# Patient Record
Sex: Female | Born: 1970 | Race: White | Hispanic: Yes | Marital: Married | Smoking: Never smoker
Health system: Southern US, Community
[De-identification: ages and names within clinical notes are randomized; demographics above are authoritative.]

---

## 2016-04-21 ENCOUNTER — Emergency Department (HOSPITAL_COMMUNITY): Payer: Worker's Compensation

## 2016-04-21 ENCOUNTER — Emergency Department (HOSPITAL_COMMUNITY)
Admission: EM | Admit: 2016-04-21 | Discharge: 2016-04-21 | Disposition: A | Discharge status: Home / Self Care | Payer: Worker's Compensation | Attending: Emergency Medicine | Admitting: Emergency Medicine

## 2016-04-21 ENCOUNTER — Encounter (HOSPITAL_COMMUNITY): Payer: Self-pay | Admitting: Emergency Medicine

## 2016-04-21 DIAGNOSIS — N390 Urinary tract infection, site not specified: Secondary | ICD-10-CM | POA: Diagnosis not present

## 2016-04-21 DIAGNOSIS — M5416 Radiculopathy, lumbar region: Secondary | ICD-10-CM | POA: Diagnosis not present

## 2016-04-21 DIAGNOSIS — M545 Low back pain, unspecified: Secondary | ICD-10-CM

## 2016-04-21 LAB — CBC WITH DIFFERENTIAL/PLATELET
BASOS ABS: 0 10*3/uL (ref 0.0–0.1)
Basophils Relative: 0 %
EOS PCT: 1 %
Eosinophils Absolute: 0.1 10*3/uL (ref 0.0–0.7)
HEMATOCRIT: 38.8 % (ref 36.0–46.0)
HEMOGLOBIN: 13.5 g/dL (ref 12.0–15.0)
LYMPHS ABS: 1.8 10*3/uL (ref 0.7–4.0)
LYMPHS PCT: 21 %
MCH: 31.1 pg (ref 26.0–34.0)
MCHC: 34.8 g/dL (ref 30.0–36.0)
MCV: 89.4 fL (ref 78.0–100.0)
Monocytes Absolute: 0.4 10*3/uL (ref 0.1–1.0)
Monocytes Relative: 5 %
NEUTROS ABS: 6.5 10*3/uL (ref 1.7–7.7)
Neutrophils Relative %: 73 %
Platelets: 250 10*3/uL (ref 150–400)
RBC: 4.34 MIL/uL (ref 3.87–5.11)
RDW: 11.7 % (ref 11.5–15.5)
WBC: 8.8 10*3/uL (ref 4.0–10.5)

## 2016-04-21 LAB — URINALYSIS, ROUTINE W REFLEX MICROSCOPIC
Bilirubin Urine: NEGATIVE
GLUCOSE, UA: NEGATIVE mg/dL
HGB URINE DIPSTICK: NEGATIVE
Ketones, ur: NEGATIVE mg/dL
LEUKOCYTES UA: NEGATIVE
Nitrite: POSITIVE — AB
PROTEIN: NEGATIVE mg/dL
SPECIFIC GRAVITY, URINE: 1.043 — AB (ref 1.005–1.030)
pH: 6.5 (ref 5.0–8.0)

## 2016-04-21 LAB — BASIC METABOLIC PANEL
ANION GAP: 8 (ref 5–15)
BUN: 10 mg/dL (ref 6–20)
CHLORIDE: 106 mmol/L (ref 101–111)
CO2: 23 mmol/L (ref 22–32)
Calcium: 9.2 mg/dL (ref 8.9–10.3)
Creatinine, Ser: 0.72 mg/dL (ref 0.44–1.00)
GFR calc Af Amer: >60 mL/min (ref >60)
GFR calc non Af Amer: >60 mL/min (ref >60)
GLUCOSE: 97 mg/dL (ref 65–99)
POTASSIUM: 4.2 mmol/L (ref 3.5–5.1)
Sodium: 137 mmol/L (ref 135–145)

## 2016-04-21 LAB — URINE MICROSCOPIC-ADD ON

## 2016-04-21 MED ORDER — CEPHALEXIN 500 MG PO CAPS: 500.0000 mg | ORAL_CAPSULE | Freq: Four times a day (QID) | ORAL | Status: AC

## 2016-04-21 MED ORDER — CEFTRIAXONE SODIUM 1 G IJ SOLR
1.0000 g | Freq: Once | INTRAMUSCULAR | Status: AC
  Administered 2016-04-21: 1 g via INTRAMUSCULAR
  Filled 2016-04-21: qty 10

## 2016-04-21 MED ORDER — DEXAMETHASONE 2 MG PO TABS: ORAL_TABLET | ORAL | Status: AC

## 2016-04-21 MED ORDER — GADOBENATE DIMEGLUMINE 529 MG/ML IV SOLN
15.0000 mL | Freq: Once | INTRAVENOUS | Status: AC | PRN
  Administered 2016-04-21: 15 mL via INTRAVENOUS

## 2016-04-21 MED ORDER — HYDROMORPHONE HCL 1 MG/ML IJ SOLN
0.5000 mg | Freq: Once | INTRAMUSCULAR | Status: DC
  Filled 2016-04-21: qty 1

## 2016-04-21 MED ORDER — LIDOCAINE HCL (PF) 1 % IJ SOLN
5.0000 mL | Freq: Once | INTRAMUSCULAR | Status: AC
  Administered 2016-04-21: 5 mL
  Filled 2016-04-21: qty 5

## 2016-04-21 MED ORDER — OXYCODONE-ACETAMINOPHEN 5-325 MG PO TABS: 1.0000 | ORAL_TABLET | Freq: Four times a day (QID) | ORAL | Status: AC | PRN

## 2016-04-21 MED ORDER — OXYCODONE-ACETAMINOPHEN 5-325 MG PO TABS
1.0000 | ORAL_TABLET | Freq: Once | ORAL | Status: AC
  Administered 2016-04-21: 1 via ORAL
  Filled 2016-04-21: qty 1

## 2016-04-21 NOTE — ED Notes (Signed)
Pt here s/p back injury at work while lifting something heavy on 5/5. Pt sts pain radiates to left side. Pt also reports loss of control of bowels and bladder x 1 week. Pt ambulatory. PCP sent pt here for eval of cauda equina syndrome.

## 2016-04-21 NOTE — Discharge Instructions (Signed)
Your MRI Results: 1. 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 2. De lo contrario la resonancia magntica negativa de la columna lumbar. 3. Ninguna compresin focal de la cauda equina de otra manera.   Citica  (Sciatica)  La citica es Chief Technology Officerel dolor, debilidad, entumecimiento u hormigueo a lo largo del nervio citico. El nervio comienza en la zona inferior de la espalda y desciende por la parte posterior de cada pierna. El nervio controla los msculos de la parte inferior de la pierna y de la zona posterior de la rodilla, y transmite la sensibilidad a la parte posterior del muslo, la pierna y la planta del pie. La citica es un sntoma de otras afecciones mdicas. Por ejemplo, un dao a los nervios o algunas enfermedades como un disco herniado o un espoln seo en la columna vertebral, podran daarle o presionar en el nervio citico. Esto causa dolor, debilidad y otras sensaciones normalmente asociadas con la citica. Generalmente la citica afecta slo un lado del cuerpo. CAUSAS   Disco herniado o desplazado.  Enfermedad degenerativa del disco.  Un sndrome doloroso que compromete un msculo angosto de los glteos (sndrome piriforme).  Lesin o fractura plvica.  Embarazo.  Tumor (casos raros). SNTOMAS  Los sntomas pueden variar de leves a muy graves. Por lo general, los sntomas descienden desde la zona lumbar a las nalgas y la parte posterior de la pierna. Ellos son:   Hormigueo leve o dolor sordo en la parte inferior de la espalda, la pierna o la cadera.  Adormecimiento en la parte posterior de la pantorrilla o la planta del pie.  Sensacin de KeySpanquemazn en la zona lumbar, la pierna o la cadera.  Dolor agudo en la zona inferior de la espalda, la pierna o la cadera.  Debilidad en las piernas.  Dolor de espalda intenso que Raytheoninhibe los movimientos. Los sntomas pueden empeorar al toser, Engineering geologistestornudar, rer o estar sentado o parado  durante Con-waymucho tiempo. Adems, el sobrepeso puede empeorar los sntomas.  DIAGNSTICO  Su mdico le har un examen fsico para buscar los sntomas comunes de la citica. Le pedir que haga algunos movimientos o actividades que activaran el dolor del nervio citico. Para encontrar las causas de la citica podr indicarle otros estudios. Estos pueden ser:   Anlisis de [place]re.  Radiografas.  Pruebas de diagnstico por imgenes, como resonancia magntica o tomografa computada. TRATAMIENTO  El tratamiento se dirige a las causas de la citica. A veces, el tratamiento no es necesario, y Chief Technology Officerel dolor y Environmental health practitionerel malestar desaparecen por s mismos. Si necesita tratamiento, su mdico puede sugerir:   Medicamentos de venta libre para Engineer, materialsaliviar el dolor.  Medicamentos recetados, como antiinflamatorios, relajantes musculares o narcticos.  Aplicacin de calor o hielo en la zona del dolor.  Inyecciones de corticoides para disminuir el dolor, la irritacin y la inflamacin alrededor del nervio.  Reduccin de la Marriottactividad en los perodos de [place] Viewdolor.  Ejercicios y estiramiento del abdomen para fortalecer y Scientist, clinical (histocompatibility and immunogenetics)mejorar la flexibilidad de la columna vertebral. Su mdico puede sugerirle perder peso si el peso extra empeora el dolor de espalda.  Fisioterapia.  La ciruga para eliminar lo que presiona o pincha el nervio, como un espoln seo o parte de una hernia de disco. INSTRUCCIONES PARA EL CUIDADO EN EL HOGAR   Slo tome medicamentos de venta libre o recetados para Primary school teachercalmar el dolor o Environmental health practitionerel malestar, segn las indicaciones de su mdico.  Aplique hielo sobre el rea dolorida durante 20  minutos 3-4 veces por [employer] primeras 48-72 horas. Luego intente aplicar calor de la misma manera.  Haga ejercicios, elongue o realice sus actividades habituales, si no le causan ms dolor.  Cumpla con todas las sesiones de fisioterapia, segn le indique su mdico.  Cumpla con todas las visitas de control, segn le indique su  mdico.  No use tacones altos o zapatos que no tengan buen apoyo.  Verifique que el colchn no sea muy blando. Un colchn firme Engineer, materials y las [place]. SOLICITE ATENCIN MDICA DE INMEDIATO SI:   Pierde el control de la vejiga o del intestino (incontinencia).  Aumenta la debilidad en la zona inferior de la espalda, la pelvis, las nalgas o las piernas.  Siente irritacin o inflamacin en la espalda.  Tiene sensacin de ardor al [employer].  El dolor empeora cuando se acuesta o lo despierta por la noche.  El dolor es peor del que experiment en el pasado.  Dura ms de 4 semanas.  Pierde peso sin motivo de [place] sbita. ASEGRESE DE QUE:   Comprende estas instrucciones.  Controlar su enfermedad.  Solicitar ayuda de inmediato si no mejora o si empeora.   Esta informacin no tiene Theme park manager el consejo del mdico. Asegrese de hacerle al mdico cualquier pregunta que tenga.   Document Released: 11/06/2005 Document Revised: 07/28/2015 Elsevier Interactive Patient Education 2016 Elsevier Inc.  Infeccin urinaria  (Urinary Tract Infection)  La infeccin urinaria puede ocurrir en cualquier lugar del tracto urinario. El tracto urinario es un sistema de drenaje del cuerpo por el que se eliminan los desechos y el exceso de [place]. El tracto urinario est formado por dos riones, dos urteres, la vejiga y Engineer, mining. Los riones son rganos que tienen forma de frijol. Cada rin tiene aproximadamente el tamao del puo. Estn situados debajo de las [place], uno a cada lado de la columna vertebral CAUSAS  La causa de la infeccin son los microbios, que son organismos microscpicos, que incluyen hongos, virus, y bacterias. Estos organismos son tan pequeos que slo pueden verse a travs del microscopio. Las bacterias son los microorganismos que ms comnmente causan infecciones urinarias.  SNTOMAS  Los sntomas pueden variar segn la edad y el sexo del paciente y por la  ubicacin de la infeccin. Los sntomas en las mujeres jvenes incluyen la necesidad frecuente e intensa de orinar y una sensacin dolorosa de ardor en la vejiga o en la uretra durante la miccin. Las mujeres y los hombres mayores podrn sentir cansancio, temblores y debilidad y Futures trader musculares y Engineer, mining abdominal. Si tiene [place], puede significar que la infeccin est en los riones. Otros sntomas son dolor en la espalda o en los lados debajo de las [place], nuseas y vmitos.  DIAGNSTICO  Para diagnosticar una infeccin urinaria, el mdico le preguntar acerca de sus sntomas. [employer] una [place]. La muestra de orina se analiza para Engineer, manufacturing bacterias y glbulos blancos de Risk manager. Los glbulos blancos se forman en el organismo para ayudar a Artist las infecciones.  TRATAMIENTO  Por lo general, las infecciones urinarias pueden tratarse con medicamentos. Debido a que la [employer] de las infecciones son causadas por bacterias, por lo general pueden tratarse con antibiticos. La eleccin del antibitico y la duracin del tratamiento depender de sus sntomas y el tipo de bacteria causante de la infeccin.  INSTRUCCIONES PARA EL CUIDADO EN EL HOGAR   Si le recetaron antibiticos, tmelos exactamente como su mdico le indique. Termine  el medicamento aunque se sienta mejor despus de haber tomado slo algunos.  Beba gran cantidad de lquido para mantener la orina de tono claro o color amarillo plido.  Evite la cafena, el t y las [address]. Estas sustancias irritan la vejiga.  Vaciar la vejiga con frecuencia. Evite retener la orina durante largos perodos.  Vace la vejiga antes y despus de Management consultant.  Despus de mover el intestino, las mujeres deben higienizarse la regin perineal desde adelante hacia atrs. Use slo un papel tissue por vez. SOLICITE ATENCIN MDICA SI:   Siente dolor en la espalda.  Le sube la fiebre.  Los sntomas  no mejoran luego de [address]. SOLICITE ATENCIN MDICA DE INMEDIATO SI:   Siente dolor intenso en la espalda o en la zona inferior del abdomen.  Comienza a sentir escalofros.  Tiene nuseas o vmitos.  Tiene una sensacin continua de quemazn o molestias al [employer]. ASEGRESE DE QUE:   Comprende estas instrucciones.  Controlar su enfermedad.  Solicitar ayuda de inmediato si no mejora o empeora.   Esta informacin no tiene Theme park manager el consejo del mdico. Asegrese de hacerle al mdico cualquier pregunta que tenga.   Document Released: 08/16/2005 Document Revised: 07/31/2012 Elsevier Interactive Patient Education [employer]

## 2016-04-21 NOTE — ED Provider Notes (Signed)
45 year old female, named Ashley Jenkins. Progressive lower back pain after lifting injury 03/24/2016. Now has bowel and urinary incontinence, and concern for cauda equina syndrome. Please call Mr. Sinda DuAlbert Molina. The PA who saw her today, after evaluation. His phone number is [phone]. Bernell ListE. Haifa Hatton, MD  Mancel BaleElliott Kevonna Nolte, MD 04/21/16 [phone]

## 2016-04-21 NOTE — ED Provider Notes (Signed)
CSN: [id number]     Arrival date & time 04/21/16  1407 History   First MD Initiated Contact with Patient 04/21/16 1646     Chief Complaint  Patient presents with  . Back Pain     (Consider location/radiation/quality/duration/timing/severity/associated sxs/prior Treatment) HPI Comments: 45 year old female who is Spanish-speaking presents for concern for cauda equina. The patient was injured at work when she picked up a very heavy roll about 1 month ago. Since that time she's been referred to an outpatient physician whose been helping her to manage her back pain. She followed up with them today and reported that she's been having a change in her urinary patterns and so they referred her to the emergency department to rule out cauda equina. The patient denies fevers or chills. She reports that she has pain in her left lower back that radiates down the left leg. She states that recently she's been having trouble getting to the bathroom quickly enough. She denies urinary retention. Reports normal bowel movements. Denies any loss of sensation in her legs or in her rectal area.  History was taken using a [place] interpreter.  Patient is a 45 y.o. female presenting with back pain.  Back Pain Associated symptoms: no abdominal pain, no chest pain, no dysuria, no fever, no headaches and no weakness     History reviewed. No pertinent past medical history. History reviewed. No pertinent past surgical history. History reviewed. No pertinent family history. Social History  Substance Use Topics  . Smoking status: Never Smoker   . Smokeless tobacco: None  . Alcohol Use: No   OB History    No data available     Review of Systems  Constitutional: Negative for fever, chills and fatigue.  HENT: Negative for congestion, ear discharge, rhinorrhea and sinus pressure.   Eyes: Negative for visual disturbance.  Respiratory: Negative for cough, chest tightness and shortness of breath.   Cardiovascular:  Negative for chest pain and palpitations.  Gastrointestinal: Negative for nausea, vomiting, abdominal pain and diarrhea.  Genitourinary: Positive for urgency. Negative for dysuria, hematuria and flank pain.  Musculoskeletal: Positive for back pain. Negative for myalgias.  Skin: Negative for rash.  Neurological: Negative for dizziness, weakness, light-headedness and headaches.  Hematological: Does not bruise/bleed easily.      Allergies  Review of patient's allergies indicates no known allergies.  Home Medications   Prior to Admission medications   Medication Sig Start Date End Date Taking? Authorizing Provider  cephALEXin (KEFLEX) 500 MG capsule Take 1 capsule (500 mg total) by mouth 4 (four) times daily. 04/21/16   Leta Baptist, MD  cyclobenzaprine (FLEXERIL) 10 MG tablet Take 10 mg by mouth at bedtime as needed for muscle spasms.   Yes Historical Provider, MD  dexamethasone (DECADRON) 2 MG tablet Take 4 tablets the first day, then 3 tables the second day, and 2 tablets the third day 04/21/16   Leta Baptist, MD  naproxen sodium (ANAPROX) 550 MG tablet Take 550 mg by mouth 2 (two) times daily as needed (pain).   Yes Historical Provider, MD  oxyCODONE-acetaminophen (PERCOCET/ROXICET) 5-325 MG tablet Take 1 tablet by mouth every 6 (six) hours as needed for severe pain. 04/21/16   Leta Baptist, MD   BP 121/83 mmHg  Pulse 69  Temp(Src) 98.8 F (37.1 C) (Oral)  Resp 19  Ht  (1.575 m)  Wt 170 lb (77.111 kg)  BMI 31.09 kg/m2  SpO2 99%  LMP 04/13/2016 Physical Exam  Constitutional: She is  oriented to person, place, and time. She appears well-developed and well-nourished. No distress.  HENT:  Head: Normocephalic and atraumatic.  Right Ear: External ear normal.  Left Ear: External ear normal.  Nose: Nose normal.  Mouth/Throat: Oropharynx is clear and moist. No oropharyngeal exudate.  Eyes: EOM are normal. Pupils are equal, round, and reactive to light.  Neck: Normal range  of motion. Neck supple.  Cardiovascular: Normal rate, regular rhythm, normal heart sounds and intact distal pulses.   No murmur heard. Pulmonary/Chest: Effort normal. No respiratory distress. She has no wheezes. She has no rales.  Abdominal: Soft. She exhibits no distension. There is no tenderness.  Genitourinary: Rectal exam shows anal tone normal.  Musculoskeletal: Normal range of motion. She exhibits no edema or tenderness.  Neurological: She is alert and oriented to person, place, and time. No cranial nerve deficit. She exhibits normal muscle tone. Coordination normal.  No saddle anesthesia  Skin: Skin is warm and dry. No rash noted. She is not diaphoretic.  Vitals reviewed.   ED Course  Procedures (including critical care time) Labs Review Labs Reviewed  URINALYSIS, ROUTINE W REFLEX MICROSCOPIC (NOT AT [facility]: [facility]) - Abnormal; Notable for the following:    APPearance CLOUDY (*)    Specific Gravity, Urine 1.043 (*)    Nitrite POSITIVE (*)    All other components within normal limits  URINE MICROSCOPIC-ADD ON - Abnormal; Notable for the following:    Squamous Epithelial / LPF 6-30 (*)    Bacteria, UA FEW (*)    All other components within normal limits  CBC WITH DIFFERENTIAL/PLATELET  BASIC METABOLIC PANEL    Imaging Review Mr Lumbar Spine W Wo Contrast  04/21/2016  CLINICAL DATA:  Lifting injury at work 03/24/2016. Left-sided pain. Loss of bowel and bladder function over the last week. Evaluate for cauda equina syndrome. EXAM: MRI LUMBAR SPINE WITHOUT AND WITH CONTRAST TECHNIQUE: Multiplanar and multiecho pulse sequences of the lumbar spine were obtained without and with intravenous contrast. CONTRAST:  15mL MULTIHANCE GADOBENATE DIMEGLUMINE 529 MG/ML IV SOLN COMPARISON:  None. FINDINGS: Segmentation: Five non rib-bearing lumbar type vertebral bodies are present. Alignment: AP alignment is anatomic. There is no significant scoliosis. Vertebrae:  Marrow signal and vertebral body heights  are normal. Conus medullaris: Extends to the L1. Level and appears normal. Paraspinal and other soft tissues: Limited imaging of the abdomen is unremarkable. There is no significant adenopathy. Disc levels: The disc levels at L3-4 and above are normal. L4-5: There is some desiccation of disc signal. A far left lateral disc protrusion and annular tear results in moderate left foraminal stenosis. The central canal and right foramen are patent. L5-S1:  Negative. IMPRESSION: 1. Far left lateral disc protrusion and annular tear with moderate left foraminal stenosis at L4-5. 2. Otherwise negative MRI of the lumbar spine. 3. No focal compression of the cauda equina otherwise. Electronically Signed   By: Marin Roberts M.D.   On: 04/21/2016 19:47   I have personally reviewed and evaluated these images and lab results as part of my medical decision-making.   EKG Interpretation None      MDM  Patient seen and evaluated in stable condition. Patient neurovascularly intact. Normal rectal tone. History and physical completed using a Spanish interpreter.  MRI revealed disc protrusion and annular tear with stenosis at L4-L5. No sign of cauda equina. Patient was neurovascularly intact on my examination. UA was consistent with infection. She was given a dose of IM Rocephin. Patient was discharged home in stable  condition with a referral to neurosurgery. She was also provided with a prescription for Keflex, short course of Decadron, Percocet. I attempted to call the PA's office would send the patient in but only in connected with the voicemail. I discussed all results and plan of care in detail with the patient and her husband using the Spanish interpreter. All questions were answered prior to discharge. They expressed understanding and agreement with plan of care. Final diagnoses:  Lower back pain  Lumbar radiculopathy  UTI (lower urinary tract infection)    1. Lumbar radiculopathy 2. UTI    Leta BaptistEmily Roe  Alnisa Hasley, MD 04/21/16 [phone]

## 2016-05-03 ENCOUNTER — Other Ambulatory Visit: Payer: Self-pay | Admitting: Specialist

## 2016-05-03 DIAGNOSIS — M5127 Other intervertebral disc displacement, lumbosacral region: Secondary | ICD-10-CM

## 2016-05-09 ENCOUNTER — Other Ambulatory Visit: Payer: Self-pay

## 2016-05-11 ENCOUNTER — Ambulatory Visit
Admission: RE | Admit: 2016-05-11 | Discharge: 2016-05-11 | Disposition: A | Discharge status: Home / Self Care | Payer: Worker's Compensation | Source: Ambulatory Visit | Attending: Specialist | Admitting: Specialist

## 2016-05-11 DIAGNOSIS — M5127 Other intervertebral disc displacement, lumbosacral region: Secondary | ICD-10-CM

## 2016-05-11 MED ORDER — METHYLPREDNISOLONE ACETATE 40 MG/ML INJ SUSP (RADIOLOG
120.0000 mg | Freq: Once | INTRAMUSCULAR | Status: AC
  Administered 2016-05-11: 120 mg via EPIDURAL

## 2016-05-11 MED ORDER — IOPAMIDOL (ISOVUE-M 200) INJECTION 41%
1.0000 mL | Freq: Once | INTRAMUSCULAR | Status: AC
  Administered 2016-05-11: 1 mL via EPIDURAL

## 2016-05-11 NOTE — Discharge Instructions (Signed)

## 2016-06-02 ENCOUNTER — Other Ambulatory Visit: Payer: Self-pay | Admitting: Specialist

## 2016-06-02 DIAGNOSIS — M5432 Sciatica, left side: Secondary | ICD-10-CM

## 2016-06-06 ENCOUNTER — Ambulatory Visit
Admission: RE | Admit: 2016-06-06 | Discharge: 2016-06-06 | Disposition: A | Discharge status: Home / Self Care | Payer: Worker's Compensation | Source: Ambulatory Visit | Attending: Specialist | Admitting: Specialist

## 2016-06-06 DIAGNOSIS — M5432 Sciatica, left side: Secondary | ICD-10-CM

## 2016-06-06 IMAGING — XA DG EPIDURAL/NERVE ROOT
2 series · 2 of 2 positions shown · non-contrast
Comparison: none

CLINICAL DATA: Lumbosacral spondylosis without myelopathy.
Left-sided sciatica. Low back and left lower extremity pain.
Transient improvement following the prior injection. Repeat left L4
nerve root block requested.

[Series 1: ortho standard · 1 of 1 slices shown (1 of 2)]
[im 1/1]
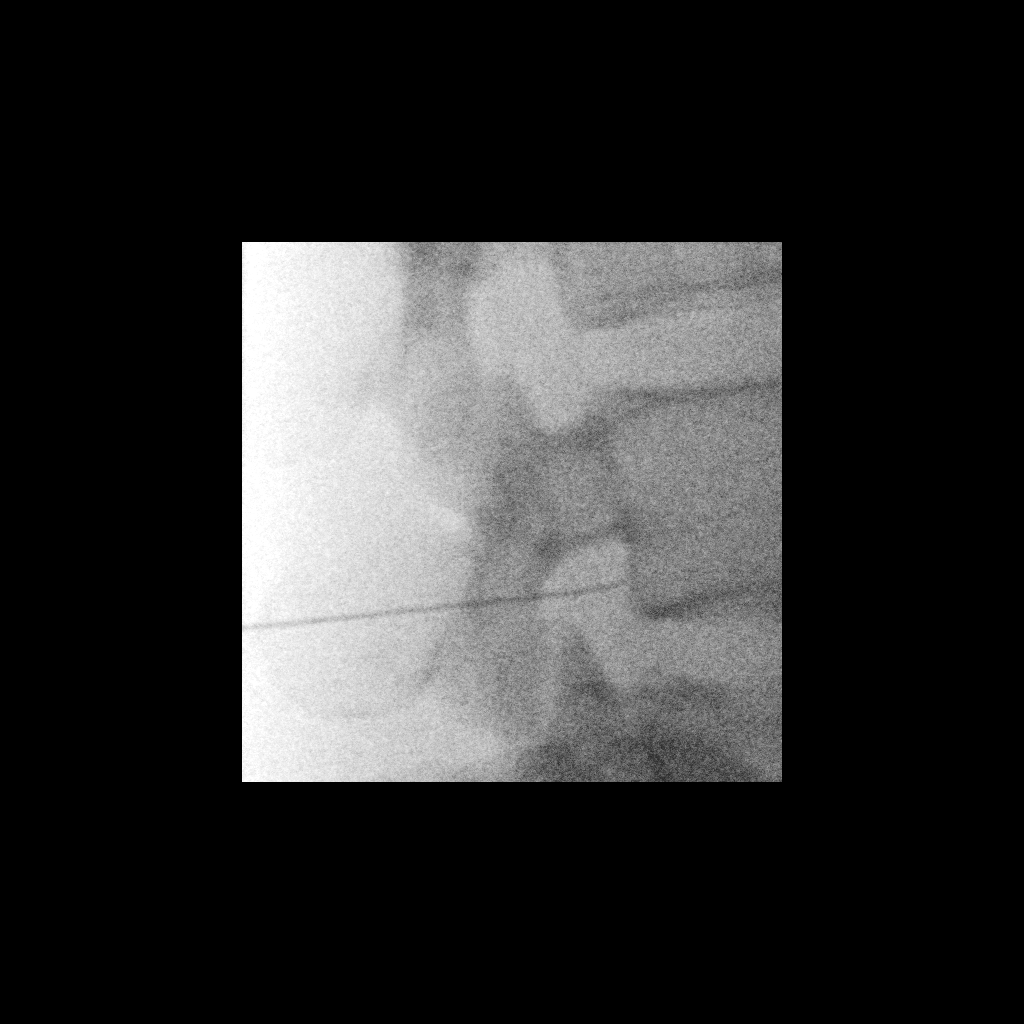

[Series 2: ortho standard · 1 of 1 slices shown (2 of 2)]
[im 1/1]
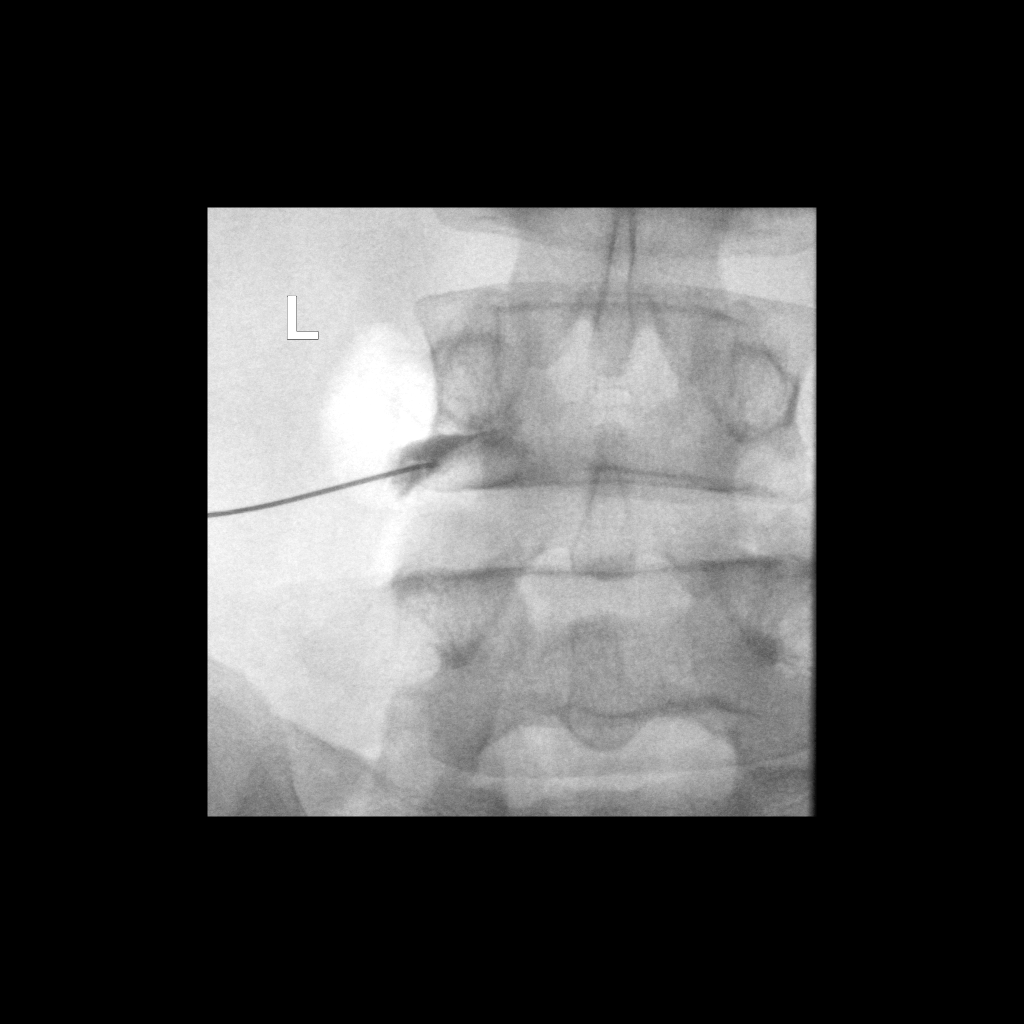

[2 of 2 positions shown; findings below may reference images not displayed]

EXAM:
EPIDURAL/NERVE ROOT

FLUOROSCOPY TIME:  Radiation Exposure Index (as provided by the
fluoroscopic device): 20.01 microGray*m^2

Fluoroscopy Time (in minutes and seconds):  17 seconds

PROCEDURE:
The procedure, risks, benefits, and alternatives were explained to
the patient via a Spanish interpreter. Questions regarding the
procedure were encouraged and answered. The patient understands and
consents to the procedure.

LEFT L4 NERVE ROOT BLOCK AND TRANSFORAMINAL EPIDURAL: A posterior
oblique approach was taken to the intervertebral foramen on the left
at L4-5 using a curved 5 inch 22 gauge spinal needle. Injection of
Isovue-M 200 outlined the left L4 nerve root and showed good
epidural spread. No vascular opacification is seen. 120 mg of
Depo-Medrol mixed with 1.5 mL of 1% lidocaine were instilled. The
procedure was well-tolerated, and the patient was discharged thirty
minutes following the injection in good condition.

COMPLICATIONS:
None
IMPRESSION: Technically successful injection consisting of a left L4 nerve root
block and transforaminal epidural.

## 2016-06-06 MED ORDER — METHYLPREDNISOLONE ACETATE 40 MG/ML INJ SUSP (RADIOLOG
120.0000 mg | Freq: Once | INTRAMUSCULAR | Status: AC
  Administered 2016-06-06: 120 mg via EPIDURAL

## 2016-06-06 MED ORDER — IOPAMIDOL (ISOVUE-M 200) INJECTION 41%
1.0000 mL | Freq: Once | INTRAMUSCULAR | Status: AC
  Administered 2016-06-06: 1 mL via EPIDURAL

## 2016-06-06 NOTE — Discharge Instructions (Signed)
